# Patient Record
Sex: Female | Born: 1990 | Race: White | Hispanic: No | Marital: Married
Health system: Southern US, Community
[De-identification: ages and names within clinical notes are randomized; demographics above are authoritative.]

## PROBLEM LIST (undated history)

## (undated) DIAGNOSIS — E282 Polycystic ovarian syndrome: Secondary | ICD-10-CM

## (undated) HISTORY — DX: Polycystic ovarian syndrome: E28.2

---

## 2017-02-23 ENCOUNTER — Emergency Department
Admission: EM | Admit: 2017-02-23 | Discharge: 2017-02-23 | Disposition: A | Discharge status: Home / Self Care | Payer: Self-pay | Attending: Emergency Medical Services | Admitting: Emergency Medical Services

## 2017-02-23 DIAGNOSIS — H6690 Otitis media, unspecified, unspecified ear: Secondary | ICD-10-CM

## 2017-02-23 DIAGNOSIS — H6692 Otitis media, unspecified, left ear: Secondary | ICD-10-CM | POA: Insufficient documentation

## 2017-02-23 MED ORDER — AMOXICILLIN 500 MG PO CAPS
500.0000 mg | ORAL_CAPSULE | Freq: Two times a day (BID) | ORAL | 0 refills | Status: AC
Start: 2017-02-23 — End: 2017-03-05

## 2017-02-23 MED ORDER — ANTIPYRINE-BENZOCAINE 5.4-1.4 % OT SOLN
3.0000 [drp] | OTIC | 0 refills | Status: AC | PRN
Start: 2017-02-23 — End: ?

## 2017-02-23 NOTE — ED Provider Notes (Signed)
Physician/Midlevel provider first contact with patient: 02/23/17 1652         [facility] EMERGENCY DEPARTMENT HISTORY AND PHYSICAL EXAM    Patient Name: Lindsey Burns, Lindsey Burns  Encounter Date:  02/23/2017  Rendering Provider: Coral Else, MD  Patient DOB:  [birth year]  MRN:  [id number]    History of Presenting Illness     Historian: Pt    26 y.o. female with no pertinent PMHx p/w worsening L otalgia starting last night. No URI symptoms, no recent travel, no allergies. Pain is exacerbated when talking, drinking, and with positional movement of the head. Denies fever and ST.     PMD:  No primary care provider on file.    Past Medical History     No past medical history on file.    Past Surgical History     No past surgical history on file.    Family History     No family history on file.    Social History     Social History     Social History   . Marital status: Divorced     Spouse name: N/A   . Number of children: N/A   . Years of education: N/A     Social History Main Topics   . Smoking status: Not on file   . Smokeless tobacco: Not on file   . Alcohol use Not on file   . Drug use: Unknown   . Sexual activity: Not on file     Other Topics Concern   . Not on file     Social History Narrative   . No narrative on file       Home Medications     Home medications reviewed by ED MD.     Previous Medications    No medications on file       Review of Systems     Constitutional:  No fever  Eyes: No discharge   ENT: +L otalgia, No ST  CV:  No CP   Resp:  No SOB or cough  GI: No abd pain, N, V, D  GU: No dysuria  MS:    Skin: No rash  Neuro:  No HA  Psych:  No behavior changes  All other systems reviewed and negative    Physical Exam     BP (!) 180/99   Pulse (!) 101   Temp 98.5 F (36.9 C)   Resp 18   Wt 120.2 kg   SpO2 97%     CONSTITUTIONAL  Patient is afebrile, Vital signs reviewed.  HEAD  Atraumatic, Normocephallc.  ENT L TM injected. Tender L ear.   NECK   Normal ROM, No jugular venous distention, No meningeal  signs.  RESPIRATORY CHEST   Chest is nontender, Breath sounds normal.  CARDIOVASCULAR   RRR, Heart sounds normal, Normal S1 S2.  NEURO  Alert, awake and oriented. No focal sensory deficits    ED Medications Administered     ED Medication Orders     None          Orders Placed During This Encounter   No orders of the defined types were placed in this encounter.      Diagnostic Study Results     The results of the diagnostic studies below were reviewed by the ED provider:    Labs  Results     ** No results found for the last 24 hours. **  Radiologic Studies  Radiology Results (24 Hour)     ** No results found for the last 24 hours. **          Scribe and MD Attestations     I, Coral Else, MD, personally performed the services documented. Enrigue Catena is scribing for me on [facility]. I reviewed and confirm the accuracy of the information in this medical record.    I, Enrigue Catena, am serving as a Neurosurgeon to document services personally performed by Coral Else, MD, based on the provider's statements to me.     Credentials:  Enrigue Catena, scribe    Rendering Provider: Coral Else, MD    Monitors, EKG, Critical Care, and Splints     EKG (interpreted by ED physician): na  Cardiac Monitor (interpreted by ED physician): na    Critical Care: na  Splint check:  na    MDM and Clinical Notes     Notes:         Consults:    Diagnosis and Disposition     Clinical Impression  1. Acute otitis media, unspecified otitis media type        Disposition  ED Disposition     ED Disposition Condition Date/Time Comment    Discharge  Thu Feb 23, 2017  5:06 PM Melvyn Neth Byard discharge to home/self care.    Condition at disposition: Stable          Prescriptions       New Prescriptions    AMOXICILLIN (AMOXIL) 500 MG CAPSULE    Take 1 capsule (500 mg total) by mouth 2 (two) times daily.for 10 days    ANTIPYRINE-BENZOCAINE (AURALGAN) OTIC SOLUTION    Place 3 drops into the left ear every 2 (two) hours  as needed for Pain.                Pedro Earls I, MD  02/25/17 [phone]

## 2017-02-23 NOTE — Discharge Instructions (Signed)
Dear Ms. Lindsey Burns:    I appreciate your choosing the [facility] for your healthcare needs, and hope your visit today was EXCELLENT.    Instructions:  Please follow-up with your primary care physician or Dr. Loran Senters next week.     Return to the Emergency Department for any worsening symptoms or concerns.    Below is some information that our patients often find helpful.    We wish you good health and please do not hesitate to contact us if we can ever be of any assistance.    Sincerely,  Maura Crandall, MD  [facility]    ________________________________________________________________    If you do not continue to improve or your condition worsens, please contact your doctor or return immediately to the Emergency Department.    Thank you for choosing [facility] for your emergency care needs.  We strive to provide EXCELLENT care to you and your family.      DOCTOR REFERRALS  Call [phone] if you need any further referrals and we can help you find a primary care doctor or specialist.  Also, available online at:  [email or web address]    YOUR CONTACT INFORMATION  Before leaving please check with registration to make sure we have an up-to-date contact number.  You can call registration at [phone] to update your information.  For questions about your hospital bill, please call [phone].  For questions about your Emergency Dept Physician bill please call [phone].      FREE HEALTH SERVICES  If you need help with health or social services, please call 2-1-1 for a free referral to resources in your area.  2-1-1 is a free service connecting people with information on health insurance, free clinics, pregnancy, mental health, dental care, food assistance, housing, and substance abuse counseling.  Also, available online at:  http://www.[facility].org    MEDICAL RECORDS AND TESTS  Certain laboratory test results do not  come back the same day, for example urine cultures.   We will contact you if other important findings are noted.  Radiology films are often reviewed again to ensure accuracy.  If there is any discrepancy, we will notify you.      Please call [phone] to pick up a complimentary CD of any radiology studies performed.  If you or your doctor would like to request a copy of your medical records, please call [phone].      ORTHOPEDIC INJURY   Please know that significant injuries can exist even when an initial x-ray is read as normal or negative.  This can occur because some fractures (broken bones) are not initially visible on x-rays.  For this reason, close outpatient follow-up with your primary care doctor or bone specialist (orthopedist) is required.    MEDICATIONS AND FOLLOWUP  Please be aware that some prescription medications can cause drowsiness.  Use caution when driving or operating machinery.    The examination and treatment you have received in our Emergency Department is provided on an emergency basis, and is not intended to be a substitute for your primary care physician.  It is important that your doctor checks you again and that you report any new or remaining problems at that time.      24 HOUR PHARMACIES  [facility] - [address] [place], [place] [address] (1.4 miles, 7 minutes)  [facility] - [address], [place], [place] [address] (6.5 miles, 13 minutes)  Handout with directions available on request

## 2021-01-29 ENCOUNTER — Emergency Department (HOSPITAL_COMMUNITY): Payer: Medicaid - Out of State

## 2021-01-29 ENCOUNTER — Encounter (HOSPITAL_COMMUNITY): Payer: Self-pay | Admitting: Emergency Medicine

## 2021-01-29 ENCOUNTER — Emergency Department (HOSPITAL_COMMUNITY)
Admission: EM | Admit: 2021-01-29 | Discharge: 2021-01-29 | Disposition: A | Discharge status: Home / Self Care | Payer: Medicaid - Out of State | Attending: Emergency Medicine | Admitting: Emergency Medicine

## 2021-01-29 ENCOUNTER — Other Ambulatory Visit: Payer: Self-pay

## 2021-01-29 DIAGNOSIS — M5431 Sciatica, right side: Secondary | ICD-10-CM

## 2021-01-29 DIAGNOSIS — Z7984 Long term (current) use of oral hypoglycemic drugs: Secondary | ICD-10-CM | POA: Insufficient documentation

## 2021-01-29 DIAGNOSIS — R0789 Other chest pain: Secondary | ICD-10-CM | POA: Diagnosis not present

## 2021-01-29 DIAGNOSIS — Z91040 Latex allergy status: Secondary | ICD-10-CM | POA: Diagnosis not present

## 2021-01-29 DIAGNOSIS — M5441 Lumbago with sciatica, right side: Secondary | ICD-10-CM | POA: Insufficient documentation

## 2021-01-29 HISTORY — DX: Polycystic ovarian syndrome: E28.2

## 2021-01-29 LAB — CBC WITH DIFFERENTIAL/PLATELET
Abs Immature Granulocytes: 0.1 10*3/uL — ABNORMAL HIGH (ref 0.00–0.07)
Basophils Absolute: 0.1 10*3/uL (ref 0.0–0.1)
Basophils Relative: 1 %
Eosinophils Absolute: 0.1 10*3/uL (ref 0.0–0.5)
Eosinophils Relative: 1 %
HCT: 40.5 % (ref 36.0–46.0)
Hemoglobin: 13.1 g/dL (ref 12.0–15.0)
Immature Granulocytes: 1 %
Lymphocytes Relative: 26 %
Lymphs Abs: 2.2 10*3/uL (ref 0.7–4.0)
MCH: 28.9 pg (ref 26.0–34.0)
MCHC: 32.3 g/dL (ref 30.0–36.0)
MCV: 89.2 fL (ref 80.0–100.0)
Monocytes Absolute: 0.6 10*3/uL (ref 0.1–1.0)
Monocytes Relative: 7 %
Neutro Abs: 5.5 10*3/uL (ref 1.7–7.7)
Neutrophils Relative %: 64 %
Platelets: 322 10*3/uL (ref 150–400)
RBC: 4.54 MIL/uL (ref 3.87–5.11)
RDW: 14 % (ref 11.5–15.5)
WBC: 8.6 10*3/uL (ref 4.0–10.5)
nRBC: 0 % (ref 0.0–0.2)

## 2021-01-29 LAB — TROPONIN I (HIGH SENSITIVITY)
Troponin I (High Sensitivity): 2 ng/L (ref <18)
Troponin I (High Sensitivity): 3 ng/L (ref <18)

## 2021-01-29 LAB — BASIC METABOLIC PANEL
Anion gap: 7 (ref 5–15)
BUN: 10 mg/dL (ref 6–20)
CO2: 26 mmol/L (ref 22–32)
Calcium: 9.3 mg/dL (ref 8.9–10.3)
Chloride: 108 mmol/L (ref 98–111)
Creatinine, Ser: 0.57 mg/dL (ref 0.44–1.00)
GFR, Estimated: >60 mL/min (ref >60)
Glucose, Bld: 97 mg/dL (ref 70–99)
Potassium: 4.4 mmol/L (ref 3.5–5.1)
Sodium: 141 mmol/L (ref 135–145)

## 2021-01-29 LAB — URINALYSIS, ROUTINE W REFLEX MICROSCOPIC
Bilirubin Urine: NEGATIVE
Glucose, UA: NEGATIVE mg/dL
Hgb urine dipstick: NEGATIVE
Ketones, ur: NEGATIVE mg/dL
Leukocytes,Ua: NEGATIVE
Nitrite: NEGATIVE
Protein, ur: NEGATIVE mg/dL
Specific Gravity, Urine: 1.023 (ref 1.005–1.030)
pH: 6 (ref 5.0–8.0)

## 2021-01-29 LAB — D-DIMER, QUANTITATIVE: D-Dimer, Quant: 0.52 ug/mL-FEU — ABNORMAL HIGH (ref 0.00–0.50)

## 2021-01-29 LAB — PREGNANCY, URINE: Preg Test, Ur: NEGATIVE

## 2021-01-29 MED ORDER — PREDNISONE 10 MG (21) PO TBPK: ORAL_TABLET | ORAL | 0 refills | Status: AC

## 2021-01-29 MED ORDER — IOHEXOL 350 MG/ML SOLN
75.0000 mL | Freq: Once | INTRAVENOUS | Status: AC | PRN
  Administered 2021-01-29: 75 mL via INTRAVENOUS

## 2021-01-29 MED ORDER — NAPROXEN 500 MG PO TABS: 500.0000 mg | ORAL_TABLET | Freq: Two times a day (BID) | ORAL | 0 refills | Status: AC

## 2021-01-29 NOTE — ED Notes (Signed)
Patient ambulated to the bathroom with difficulty.  Denies chest pain but at times has tightness

## 2021-01-29 NOTE — ED Notes (Signed)
Patient to radiology.

## 2021-01-29 NOTE — ED Triage Notes (Signed)
Patient co having chest pain x 2.5 days.  Patient reports that she felt like her legs were numb last night.  Patient denies any type of injury for fall.  Patient denies having these symptoms before

## 2021-01-29 NOTE — ED Notes (Signed)
Patient in CT

## 2021-01-29 NOTE — ED Provider Notes (Signed)
[facility] EMERGENCY DEPARTMENT Provider Note  CSN: [id number] Arrival date & time: 01/29/21 8242    History Chief Complaint  Patient presents with   Chest Pain    Laurie Shaw is a 30 y.o. female with history of PCOS, otherwise healthy, recently come from [place] to visit [place]. She reports 2-3 days of midsternal chest discomfort, non radiating, worse with deep breath, comes and goes without other provoking or relieving factors. She has not had any cough, fever, congestion or SOB. She was not particularly concerned about her chest pain until last night when she noted her R leg had done numb. No described as a tingling but more of a decreased sensation, associated with aching pain from R hip and down the leg. She has had some back pains recently, but no falls or injuries. She denies any weakness.    Past Medical History:  Diagnosis Date   PCOS (polycystic ovarian syndrome)       No family history on file.      Home Medications Prior to Admission medications   Medication Sig Start Date End Date Taking? Authorizing Provider  Ferrous Sulfate (IRON PO) Take 1 tablet by mouth daily.   Yes [provider]  metFORMIN (GLUCOPHAGE) 500 MG tablet Take 500 mg by mouth daily.   Yes [provider]  naproxen (NAPROSYN) 500 MG tablet Take 1 tablet (500 mg total) by mouth 2 (two) times daily. 01/29/21  Yes Pollyann Savoy, MD  predniSONE (STERAPRED UNI-PAK 21 TAB) 10 MG (21) TBPK tablet 10mg  Tabs, 6 day taper. Use as directed 01/29/21  Yes 01/31/21, MD     Allergies    Lorabid [loracarbef], Latex, and Zithromax [azithromycin]   Review of Systems   Review of Systems A comprehensive review of systems was completed and negative except as noted in HPI.    Physical Exam BP (!) 153/89   Pulse 66   Temp 97.8 F (36.6 C) (Oral)   Resp 13   Ht 5\' 2"  (1.575 m)   Wt 119.3 kg   LMP  (LMP Unknown)   SpO2 97%   BMI 48.10 kg/m   Physical  Exam Vitals and nursing note reviewed.  Constitutional:      Appearance: Normal appearance.  HENT:     Head: Normocephalic and atraumatic.     Nose: Nose normal.     Mouth/Throat:     Mouth: Mucous membranes are moist.  Eyes:     Extraocular Movements: Extraocular movements intact.     Conjunctiva/sclera: Conjunctivae normal.  Cardiovascular:     Rate and Rhythm: Normal rate.  Pulmonary:     Effort: Pulmonary effort is normal.     Breath sounds: Normal breath sounds.  Chest:     Chest wall: No tenderness.  Abdominal:     General: Abdomen is flat.     Palpations: Abdomen is soft.     Tenderness: There is no abdominal tenderness.  Musculoskeletal:        General: No swelling. Normal range of motion.     Cervical back: Neck supple.  Skin:    General: Skin is warm and dry.  Neurological:     General: No focal deficit present.     Mental Status: She is alert and oriented to person, place, and time. Mental status is at baseline.     Cranial Nerves: No cranial nerve deficit.     Sensory: No sensory deficit.     Motor: No weakness.  Gait: Gait normal.  Psychiatric:        Mood and Affect: Mood normal.     ED Results / Procedures / Treatments   Labs (all labs ordered are listed, but only abnormal results are displayed) Labs Reviewed  URINALYSIS, ROUTINE W REFLEX MICROSCOPIC - Abnormal; Notable for the following components:      Result Value   APPearance HAZY (*)    All other components within normal limits  CBC WITH DIFFERENTIAL/PLATELET - Abnormal; Notable for the following components:   Abs Immature Granulocytes 0.10 (*)    All other components within normal limits  D-DIMER, QUANTITATIVE - Abnormal; Notable for the following components:   D-Dimer, Quant 0.52 (*)    All other components within normal limits  PREGNANCY, URINE  BASIC METABOLIC PANEL  TROPONIN I (HIGH SENSITIVITY)  TROPONIN I (HIGH SENSITIVITY)    EKG EKG Interpretation  Date/Time:  Friday  January 29 2021 09:53:37 EDT Ventricular Rate:  84 PR Interval:  161 QRS Duration: 104 QT Interval:  353 QTC Calculation: 418 R Axis:   33 Text Interpretation: Sinus rhythm Low voltage, precordial leads No old tracing to compare Confirmed by Susy Frizzle [phone]) on 01/29/2021 10:24:51 AM  Radiology DG Chest 2 View  Result Date: 01/29/2021 CLINICAL DATA:  Chest tightness. EXAM: CHEST - 2 VIEW COMPARISON:  None. FINDINGS: The heart size and mediastinal contours are within normal limits. Both lungs are clear. The visualized skeletal structures are unremarkable. IMPRESSION: No active cardiopulmonary disease. Electronically Signed   By: Obie Dredge M.D.   On: 01/29/2021 10:46   CT Angio Chest PE W/Cm &/Or Wo Cm  Result Date: 01/29/2021 CLINICAL DATA:  PE suspected, low/intermediate prob, positive D-dimer EXAM: CT ANGIOGRAPHY CHEST WITH CONTRAST TECHNIQUE: Multidetector CT imaging of the chest was performed using the standard protocol during bolus administration of intravenous contrast. Multiplanar CT image reconstructions and MIPs were obtained to evaluate the vascular anatomy. CONTRAST:  12mL OMNIPAQUE IOHEXOL 350 MG/ML SOLN COMPARISON:  None. FINDINGS: Cardiovascular: Satisfactory opacification of the pulmonary arteries to the segmental level. No evidence of pulmonary embolism. Thoracic aorta is normal in caliber. Normal heart size. No pericardial effusion. Mediastinum/Nodes: No enlarged mediastinal, hilar, or axillary lymph nodes. The thyroid gland appears normal. Lungs/Pleura: The inferior-most aspect of the lung bases are excluded from the field of view. No pleural effusion. No pneumothorax. No mass or focal consolidation. No suspicious pulmonary nodules. Musculoskeletal: No aggressive osseous lesions. Upper abdomen: The visualized upper abdomen is unremarkable. Review of the MIP images confirms the above findings. IMPRESSION: No pulmonary embolism.  No acute findings in the chest.  Electronically Signed   By: Olive Bass M.D.   On: 01/29/2021 12:13    Procedures Procedures  Medications Ordered in the ED Medications  iohexol (OMNIPAQUE) 350 MG/ML injection 75 mL (75 mLs Intravenous Contrast Given 01/29/21 1136)     MDM Rules/Calculators/A&P MDM Patient here with two complaints, first is atypical chest pains. Low risk factors, she does have PCOS and recent travel. She smokes but is not on birth control. Will check labs including Trop and dimer, CXR.   Also having R leg pain/numbness, no focal neuro deficits to suggest central cause. Suspect sciatica.  ED Course  I have reviewed the triage vital signs and the nursing notes.  Pertinent labs & imaging results that were available during my care of the patient were reviewed by me and considered in my medical decision making (see chart for details).  Clinical Course as of 01/29/21  1402  Fri Jan 29, 2021  1051 UA and HCG are neg.  [CS]  1051 CXR is clear [CS]  1103 CBC is normal.  [CS]  1118 Dimer is mildly elevated, will send for CTA to rule out PE.  [CS]  1126 BMP and Trop are normal.  [CS]  1217 CTA is negative.  [CS]  1400 Repeat Trop is neg. No signs of acute cardiac or pulmonary cause of her chest pain. R leg pain/numbness is likely sciatica. Will treat with NSAIDs, predpak and PCP follow up.  [CS]    Clinical Course User Index [CS] Pollyann Savoy, MD    Final Clinical Impression(s) / ED Diagnoses Final diagnoses:  Atypical chest pain  Sciatica of right side    Rx / DC Orders ED Discharge Orders          Ordered    naproxen (NAPROSYN) 500 MG tablet  2 times daily        01/29/21 1401    predniSONE (STERAPRED UNI-PAK 21 TAB) 10 MG (21) TBPK tablet        01/29/21 1401             Pollyann Savoy, MD 01/29/21 1402

## 2022-09-16 IMAGING — CR DG CHEST 2V
2 series · 2 of 2 positions shown · non-contrast
Comparison: None.

CLINICAL DATA: Chest tightness.

EXAM:
CHEST - 2 VIEW

[w chest pa]
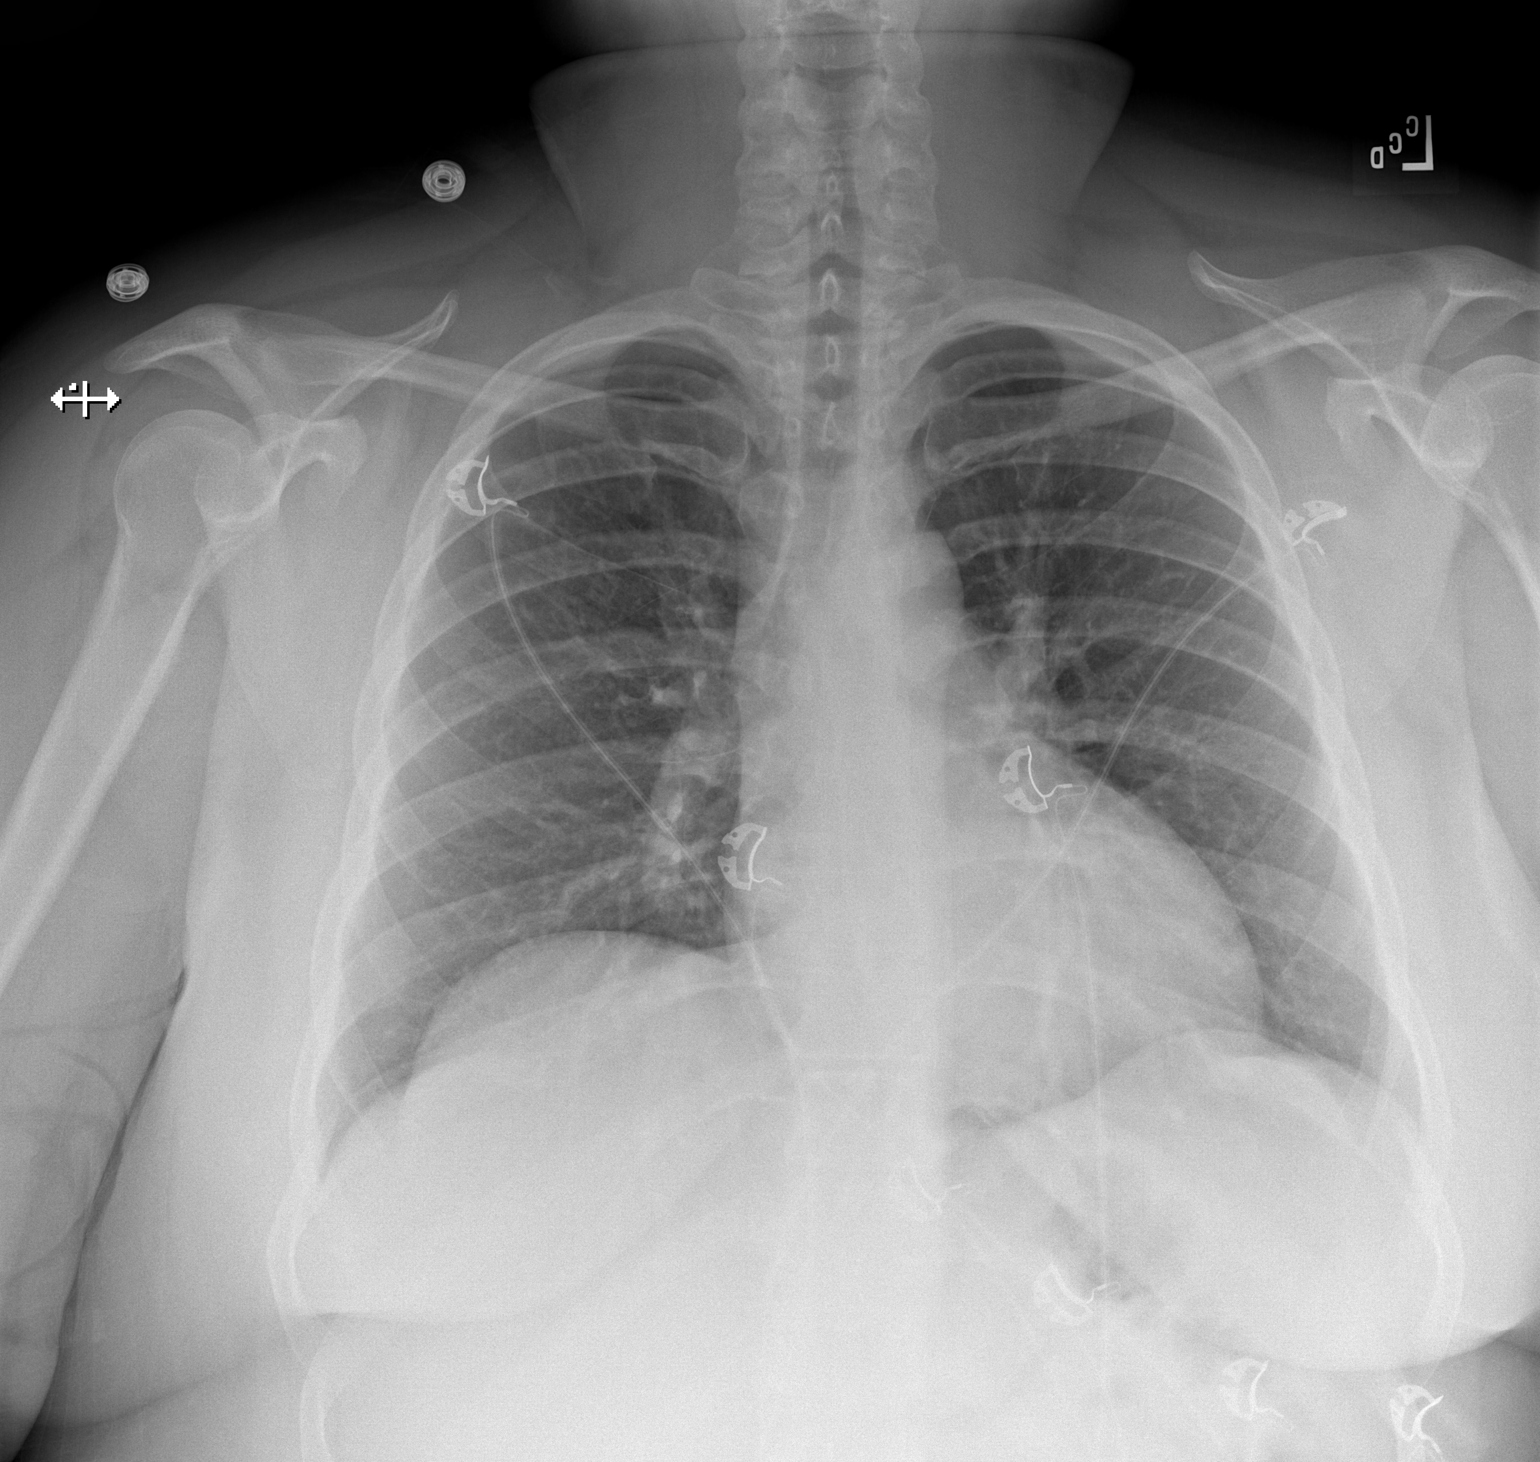

[w chest lat]
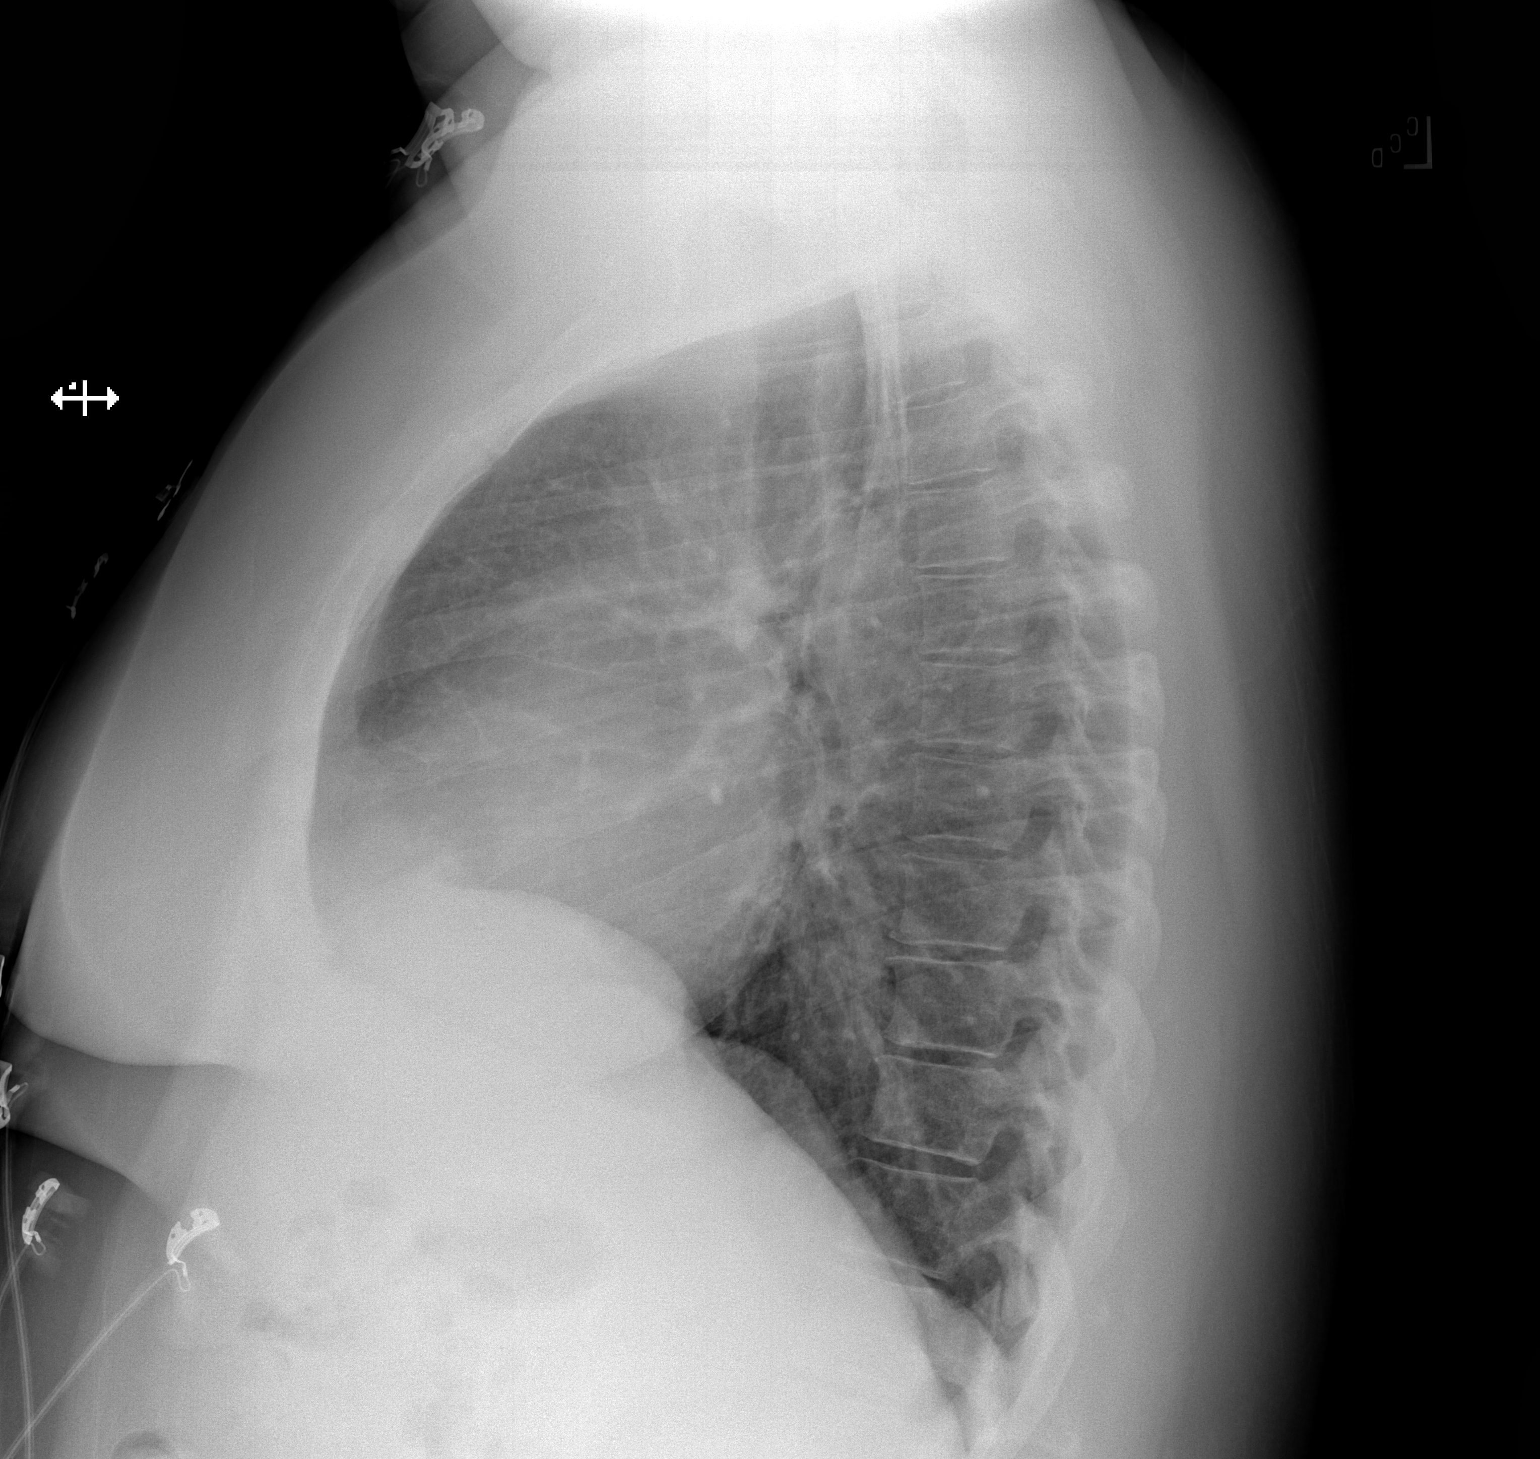

[2 of 2 positions shown; findings below may reference images not displayed]

FINDINGS: The heart size and mediastinal contours are within normal limits.
Both lungs are clear. The visualized skeletal structures are
unremarkable.
IMPRESSION: No active cardiopulmonary disease.

## 2022-09-16 IMAGING — CT CT ANGIO CHEST
2 of 6 series · 18 of 36 positions shown · IV contrast (omnipaque)
Comparison: None.

CLINICAL DATA: PE suspected, low/intermediate prob, positive
D-dimer

EXAM:
CT ANGIOGRAPHY CHEST WITH CONTRAST
TECHNIQUE: Multidetector CT imaging of the chest was performed using the
standard protocol during bolus administration of intravenous
contrast. Multiplanar CT image reconstructions and MIPs were
obtained to evaluate the vascular anatomy.
CONTRAST:  75mL OMNIPAQUE IOHEXOL 350 MG/ML SOLN

[Series 5: thins · axial · 0.75mm/px · z∈[+1342,+1566]mm · 17 of 251 slices shown]
[im 14/251  lung]
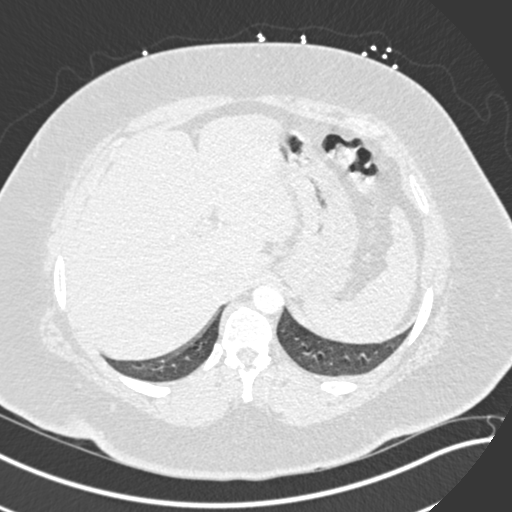
[im 28/251  mediastinal]
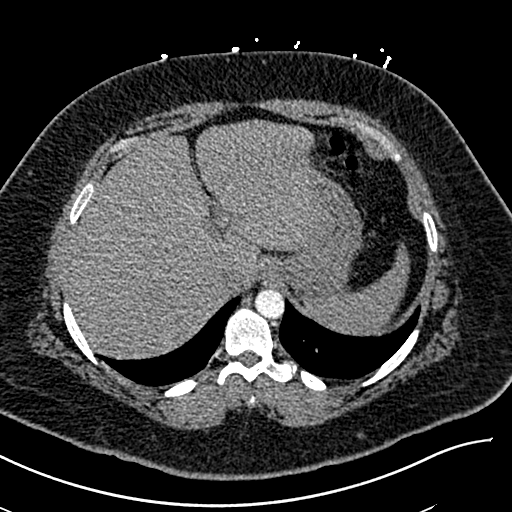
[im 42/251  lung]
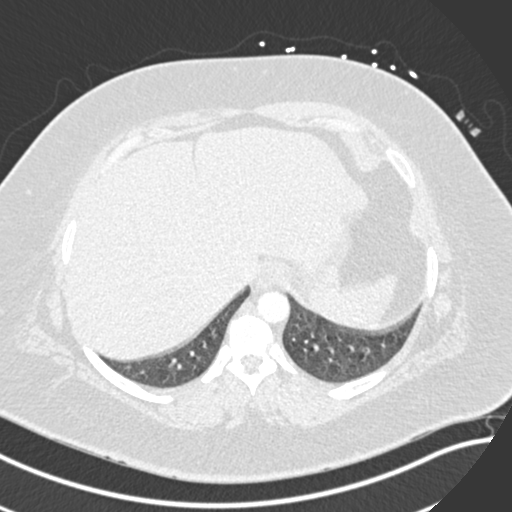
[im 56/251  mediastinal]
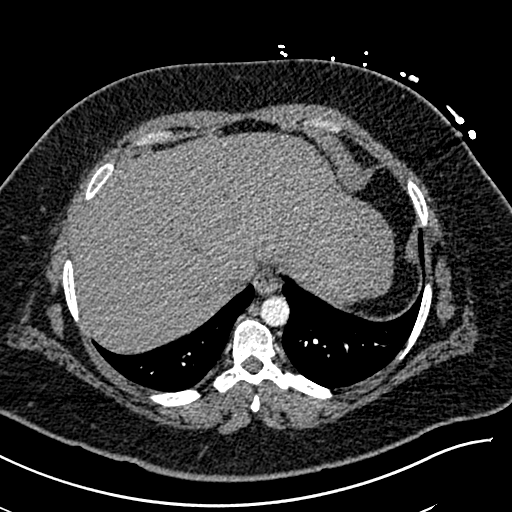
[im 70/251  lung]
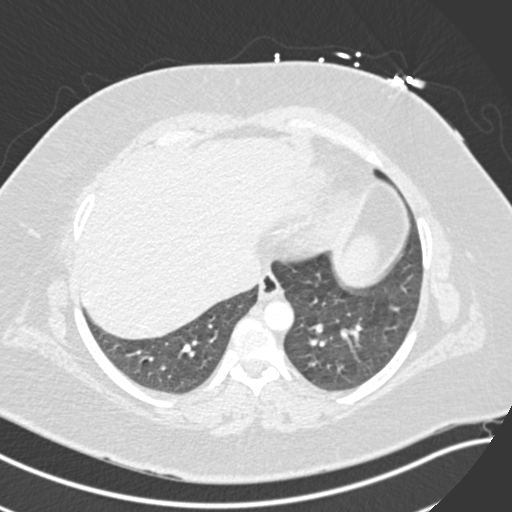
[im 84/251  mediastinal]
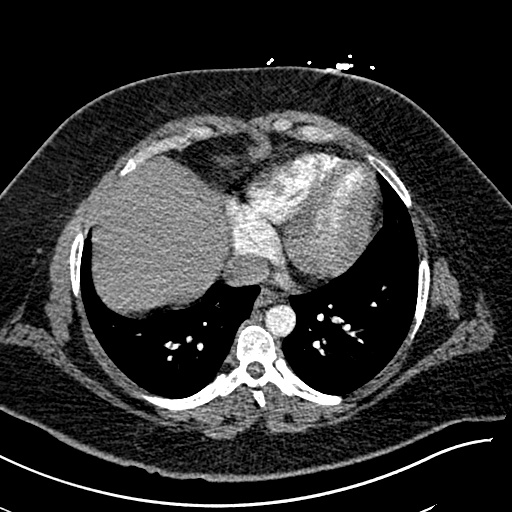
[im 98/251  lung]
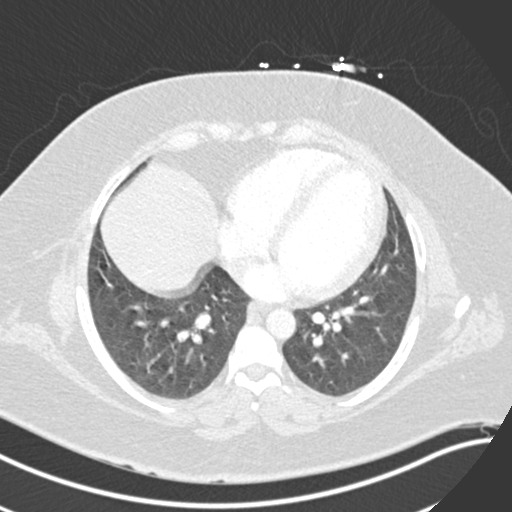
[im 112/251  mediastinal]
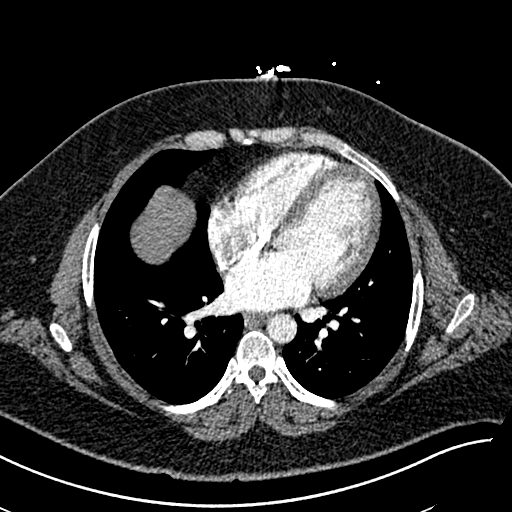
[im 126/251  lung]
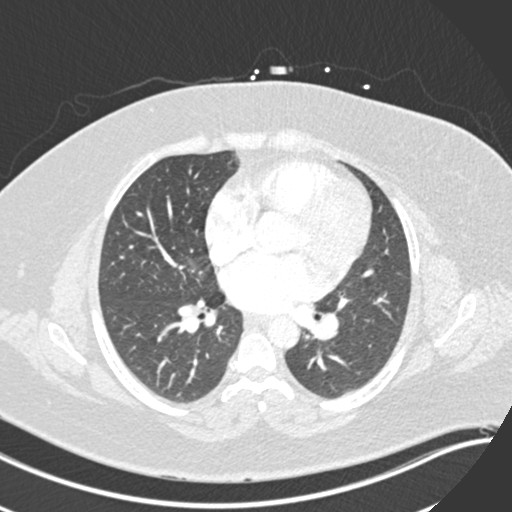
[im 139/251  mediastinal]
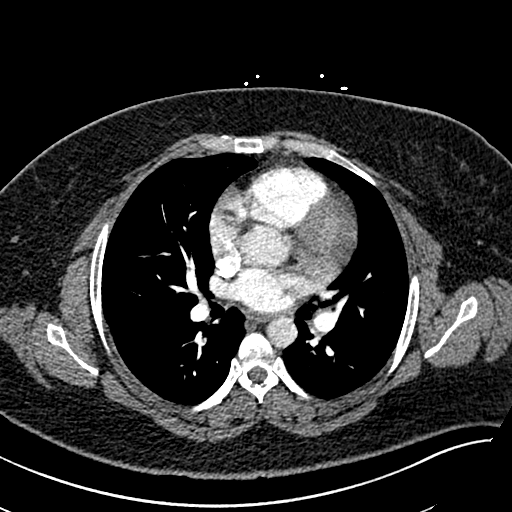
[im 153/251  lung]
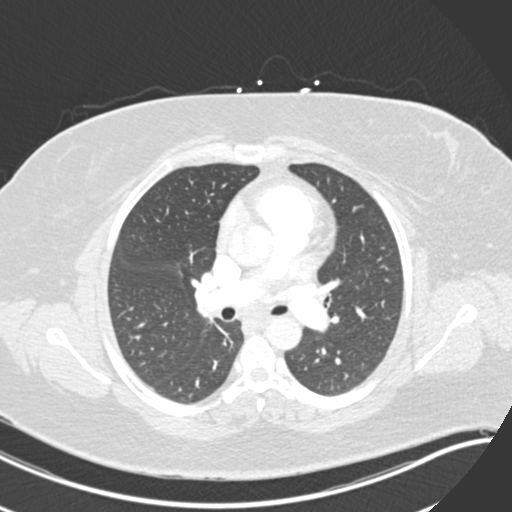
[im 167/251  mediastinal]
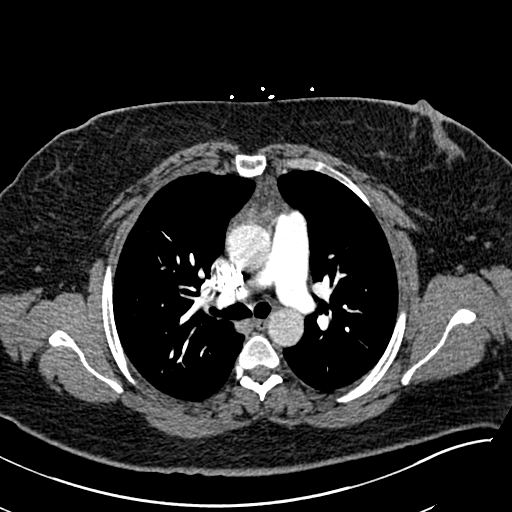
[im 181/251  lung]
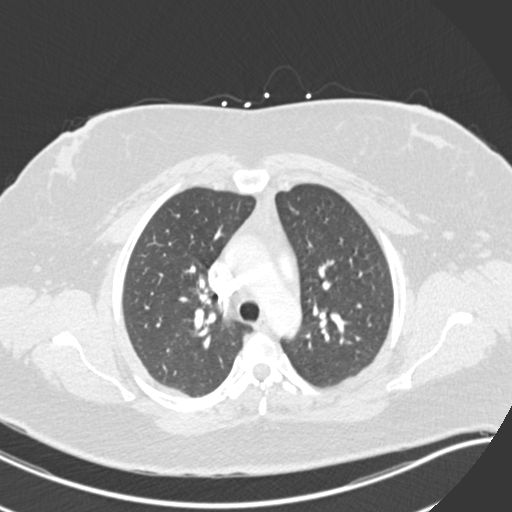
[im 195/251  mediastinal]
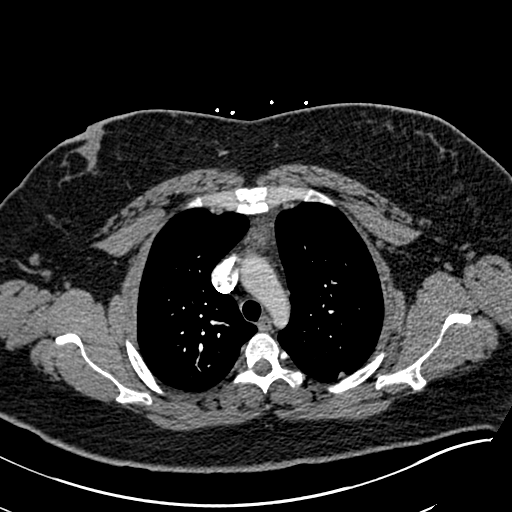
[im 209/251  lung]
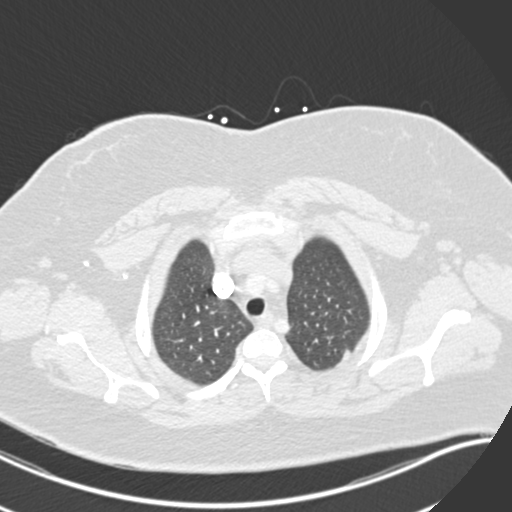
[im 223/251  mediastinal]
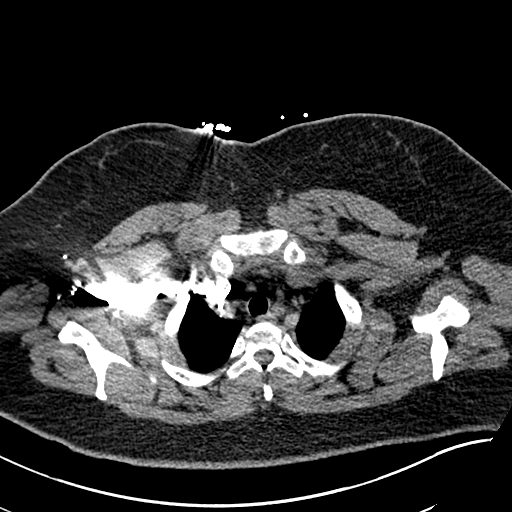
[im 237/251  lung]
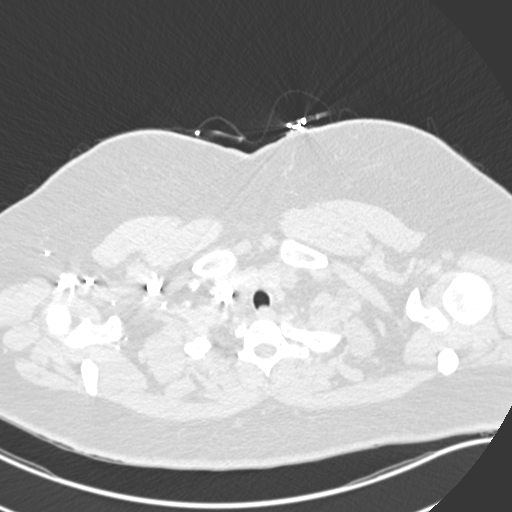

[Series 7: coronal mpr · coronal · 0.59mm/px · 1 of 181 slices shown]
[im 91/181  mediastinal]
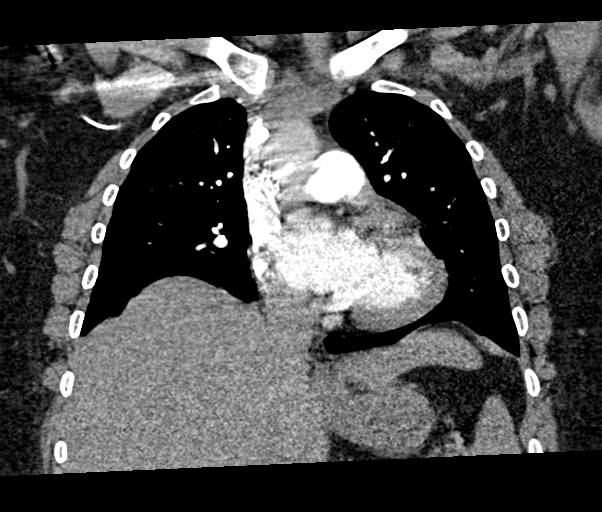

[18 of 36 positions shown; findings below may reference images not displayed]

FINDINGS: Cardiovascular: Satisfactory opacification of the pulmonary arteries
to the segmental level. No evidence of pulmonary embolism. Thoracic
aorta is normal in caliber. Normal heart size. No pericardial
effusion.

Mediastinum/Nodes: No enlarged mediastinal, hilar, or axillary lymph
nodes. The thyroid gland appears normal.

Lungs/Pleura: The inferior-most aspect of the lung bases are
excluded from the field of view. No pleural effusion. No
pneumothorax. No mass or focal consolidation. No suspicious
pulmonary nodules.

Musculoskeletal: No aggressive osseous lesions.

Upper abdomen: The visualized upper abdomen is unremarkable.

Review of the MIP images confirms the above findings.
IMPRESSION: No pulmonary embolism.  No acute findings in the chest.
